# Patient Record
Sex: Female | Born: 1950 | Race: White | Hispanic: No | Marital: Married
Health system: Southern US, Community
[De-identification: ages and names within clinical notes are randomized; demographics above are authoritative.]

---

## 2012-09-19 ENCOUNTER — Emergency Department: Payer: Self-pay | Admitting: Internal Medicine

## 2012-09-19 ENCOUNTER — Ambulatory Visit: Payer: Self-pay | Admitting: Orthopedic Surgery

## 2012-09-19 LAB — COMPREHENSIVE METABOLIC PANEL
Anion Gap: 11 (ref 7–16)
BUN: 14 mg/dL (ref 7–18)
Calcium, Total: 8.9 mg/dL (ref 8.5–10.1)
Chloride: 105 mmol/L (ref 98–107)
Co2: 23 mmol/L (ref 21–32)
Osmolality: 279 (ref 275–301)
Potassium: 3.1 mmol/L — ABNORMAL LOW (ref 3.5–5.1)
SGPT (ALT): 27 U/L (ref 12–78)
Sodium: 139 mmol/L (ref 136–145)

## 2012-09-19 LAB — PROTIME-INR: INR: 0.9

## 2012-09-19 LAB — CBC
HCT: 39.9 % (ref 35.0–47.0)
MCHC: 34.9 g/dL (ref 32.0–36.0)
MCV: 83 fL (ref 80–100)
Platelet: 220 10*3/uL (ref 150–440)
RDW: 15.4 % — ABNORMAL HIGH (ref 11.5–14.5)
WBC: 15.8 10*3/uL — ABNORMAL HIGH (ref 3.6–11.0)

## 2013-04-28 IMAGING — CR DG CHEST 1V
1 series · 1 of 1 positions shown · non-contrast
Comparison: none

REASON FOR EXAM: preop
COMMENTS:

PROCEDURE:     DXR - DXR CHEST 1 VIEWAP OR PA  - September 19, 2012  [DATE]
RESULT:     Mild atelectasis versus infiltrate right lung base. Cardiomegaly.

[ap]
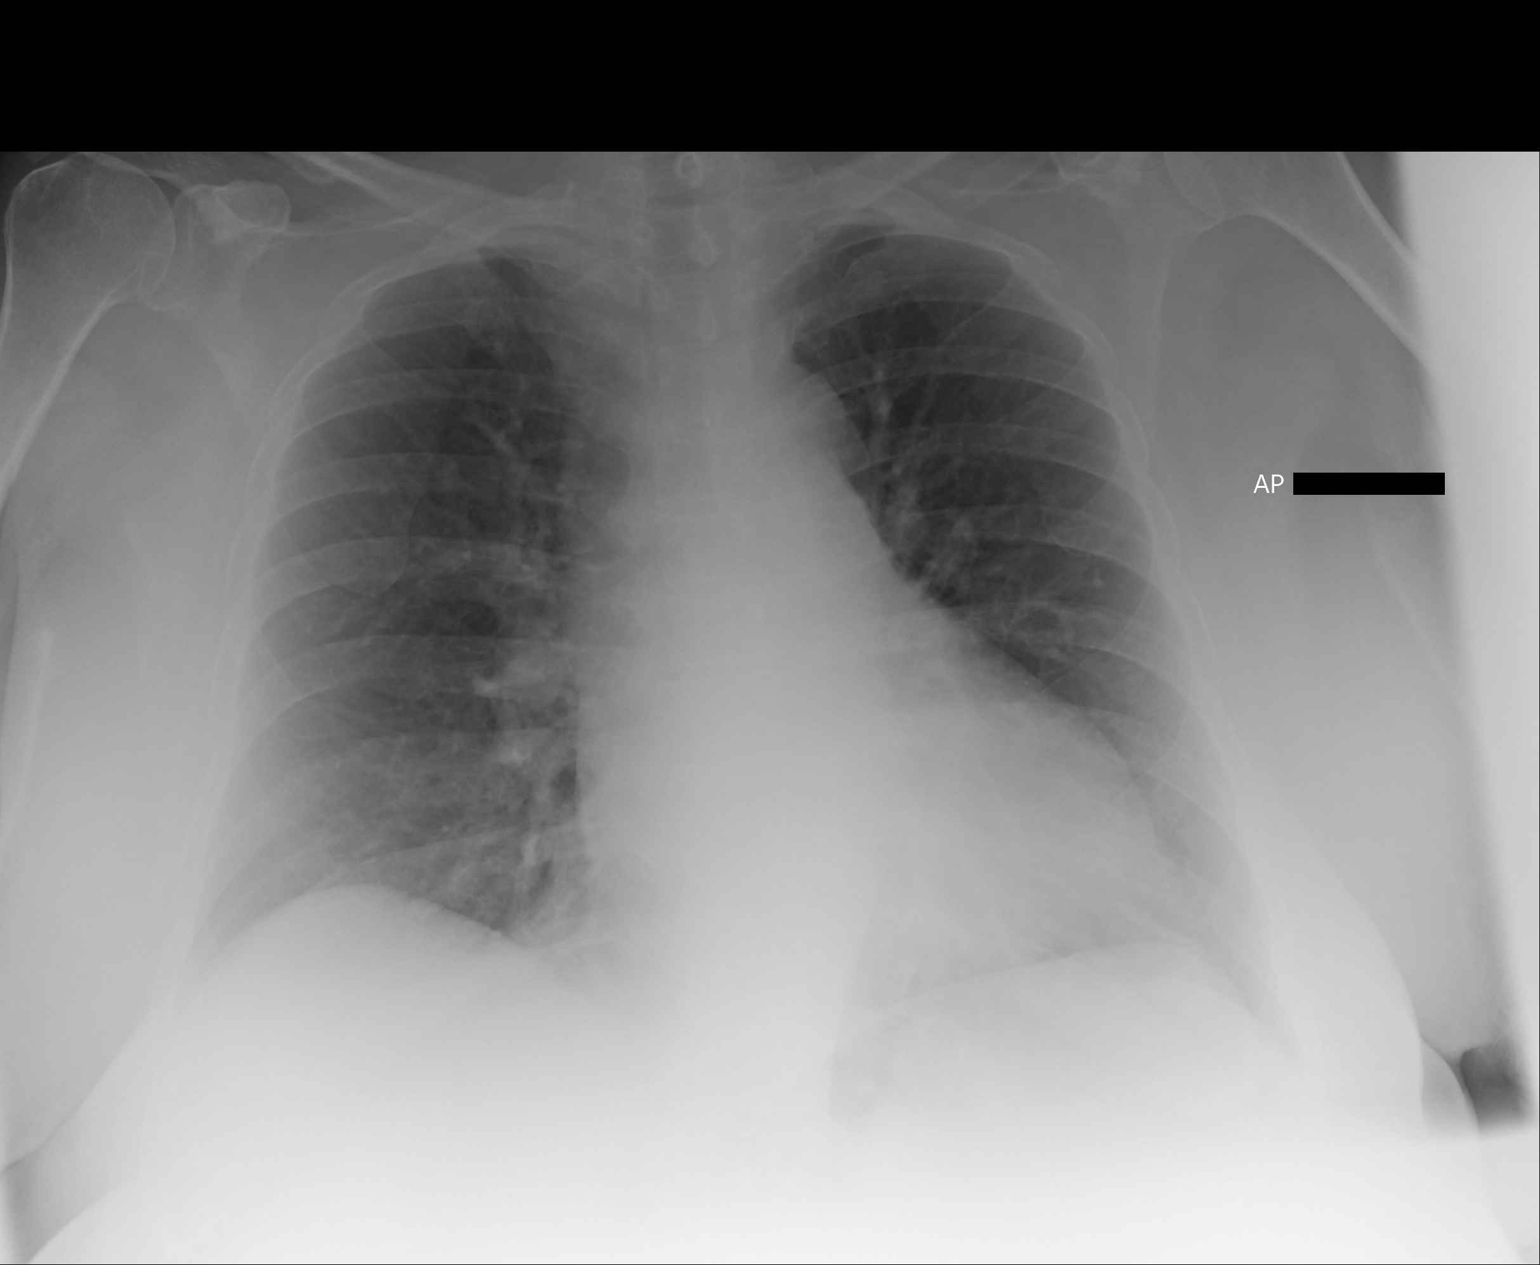

[1 of 1 positions shown; findings below may reference images not displayed]

IMPRESSION: Mild atelectasis versus infiltrate right lung base.

## 2015-01-14 NOTE — Consult Note (Signed)
    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 She bumped her chin but did not loose consciousness and denies neck pain or pain anywhere else other than her left arm.   Past Med/Surgical Hx:  Depression:   HTN:   ALLERGIES:  Oxycontin: Alt Ment Status  HOME MEDICATIONS: Medication Instructions Status  enalapril 10 mg oral tablet 1 tab(s) orally once a day Active  oxybutynin 10 mg/24 hr oral tablet, extended release 1 tab(s) orally once a day Active  Zoloft 100 mg oral tablet 1 tab(s) orally once a day Active     Medications takes vitamin D   Family and Social History:   Family History Non-Contributory    Place of Living Home   Physical Exam:   Additional Comments PHYSICAL EXAM:- No acute distress, alert and oriented- normocephalic, atraumatic. Normal external ears and nose without gross deformity. Hearing grossly intact. Non-dysplastic facies. - capillary refill < 2 sec distally- nonlabored respirations normal ROM, no tenderness, no pain with ROM upper extremitySkin intact. TTP over deformity distal humerus, compartments softLimited due to painIntact MED/ULN n., Nml sensation to LT in med and uln.  decreased sensation to radial nerveIntact AIN/ULN n., decreased motor strength to PIN -4/5 painfulWarm, pink, perfused. 2+ rad pulse. Capillary refill <2 secondsepitrochlear lymphadenopathy upper extremityNml, symmetric, skin intact, no rashes, ulcers or lesionsNml skeletal alignment no deformityNo tenderness, masses, crepitance or effusion, compartments softNml range of motion in all joints no pain, subluxation or excessive laxity with acitve or passive ROMIntact MED/RAD/ULN n., Nml sensation to LT throughoutIntact AIN/PIN//ULN n., strength 4-5/5 throughout Warm, pink, perfused. 2+ rad pulse. Capillary  refill <2 secondsLymphadenopathy   Radiology Results: XRay:    24-Dec-13 18:14, Humerus Left   Humerus Left   REASON FOR EXAM:    pain  COMMENTS:       PROCEDURE: DXR - DXR HUMERUS LEFT  - Sep 19 2012  6:14PM     RESULT: Angulated comminuted fractures present of the distal left   humerus. Prominent displacement present.    IMPRESSION:  Severe left humeral fracture as described above.        Verified By: Gwynn BurlyHOMAS E. REGISTER, M.D., MD     Assessment/Admission Diagnosis 1. 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  Patient opted for non-surgical management.  Discussed neurpraxia of radial nerve and the plan to observe.  Taught patient signs to look for if nerve function worsens.  Advised patient to seek medical evaluation if her condition worsens.  All questions were answered.Non-weight bearing left upper extremityCoaptation splint placed in ED by orthoPlan to follow up for sarmiento brace in 1-2 wks with Dr. Maple MirzaSmithPain medicine per ED   Electronic Signatures: Darl HouseholderStabile, Jozette Castrellon J (MD)  (Signed 24-Dec-13 20:42)  Authored: CHIEF COMPLAINT and HISTORY, PAST MEDICAL/SURGIAL HISTORY, ALLERGIES, HOME MEDICATIONS, OTHER MEDICATIONS, FAMILY AND SOCIAL HISTORY, PHYSICAL EXAM, Radiology, ASSESSMENT AND PLAN   Last Updated: 24-Dec-13 20:42 by Darl HouseholderStabile, Obdulia Steier J (MD)  Electronic Signatures: Darl HouseholderStabile, Kayle Correa J (MD)  (Signed on 24-Dec-13 20:45)  Authored  Last Updated: 24-Dec-13 20:45 by Darl HouseholderStabile, Cherysh Epperly J (MD)
